# Patient Record
Sex: Male | Born: 1945 | Race: Black or African American | Hispanic: No | Marital: Married | Smoking: Never smoker
Health system: Southern US, Community
[De-identification: ages and names within clinical notes are randomized; demographics above are authoritative.]

## PROBLEM LIST (undated history)

## (undated) DIAGNOSIS — M1990 Unspecified osteoarthritis, unspecified site: Secondary | ICD-10-CM

## (undated) DIAGNOSIS — Z951 Presence of aortocoronary bypass graft: Secondary | ICD-10-CM

## (undated) DIAGNOSIS — I4891 Unspecified atrial fibrillation: Secondary | ICD-10-CM

## (undated) DIAGNOSIS — M329 Systemic lupus erythematosus, unspecified: Secondary | ICD-10-CM

---

## 2014-05-27 ENCOUNTER — Encounter (HOSPITAL_COMMUNITY): Payer: Self-pay | Admitting: *Deleted

## 2014-05-27 ENCOUNTER — Emergency Department (HOSPITAL_COMMUNITY)
Admission: EM | Admit: 2014-05-27 | Discharge: 2014-05-27 | Disposition: A | Discharge status: Home / Self Care | Payer: Medicare Other | Attending: Emergency Medicine | Admitting: Emergency Medicine

## 2014-05-27 ENCOUNTER — Emergency Department (HOSPITAL_COMMUNITY): Payer: Medicare Other

## 2014-05-27 DIAGNOSIS — M1990 Unspecified osteoarthritis, unspecified site: Secondary | ICD-10-CM | POA: Insufficient documentation

## 2014-05-27 DIAGNOSIS — Z79899 Other long term (current) drug therapy: Secondary | ICD-10-CM | POA: Diagnosis not present

## 2014-05-27 DIAGNOSIS — G8929 Other chronic pain: Secondary | ICD-10-CM | POA: Insufficient documentation

## 2014-05-27 DIAGNOSIS — R5383 Other fatigue: Secondary | ICD-10-CM | POA: Diagnosis not present

## 2014-05-27 DIAGNOSIS — Z8679 Personal history of other diseases of the circulatory system: Secondary | ICD-10-CM | POA: Insufficient documentation

## 2014-05-27 DIAGNOSIS — Z7902 Long term (current) use of antithrombotics/antiplatelets: Secondary | ICD-10-CM | POA: Insufficient documentation

## 2014-05-27 DIAGNOSIS — M791 Myalgia: Secondary | ICD-10-CM | POA: Diagnosis present

## 2014-05-27 DIAGNOSIS — R197 Diarrhea, unspecified: Secondary | ICD-10-CM | POA: Diagnosis not present

## 2014-05-27 DIAGNOSIS — Z7952 Long term (current) use of systemic steroids: Secondary | ICD-10-CM | POA: Insufficient documentation

## 2014-05-27 DIAGNOSIS — Z951 Presence of aortocoronary bypass graft: Secondary | ICD-10-CM | POA: Insufficient documentation

## 2014-05-27 HISTORY — DX: Systemic lupus erythematosus, unspecified: M32.9

## 2014-05-27 HISTORY — DX: Unspecified atrial fibrillation: I48.91

## 2014-05-27 HISTORY — DX: Presence of aortocoronary bypass graft: Z95.1

## 2014-05-27 HISTORY — DX: Unspecified osteoarthritis, unspecified site: M19.90

## 2014-05-27 LAB — URINALYSIS, ROUTINE W REFLEX MICROSCOPIC
Bilirubin Urine: NEGATIVE
GLUCOSE, UA: NEGATIVE mg/dL
HGB URINE DIPSTICK: NEGATIVE
Ketones, ur: NEGATIVE mg/dL
LEUKOCYTES UA: NEGATIVE
Nitrite: NEGATIVE
PH: 7.5 (ref 5.0–8.0)
Protein, ur: NEGATIVE mg/dL
SPECIFIC GRAVITY, URINE: 1.017 (ref 1.005–1.030)
UROBILINOGEN UA: 0.2 mg/dL (ref 0.0–1.0)

## 2014-05-27 LAB — CBC WITH DIFFERENTIAL/PLATELET
Basophils Absolute: 0 10*3/uL (ref 0.0–0.1)
Basophils Relative: 1 % (ref 0–1)
EOS PCT: 2 % (ref 0–5)
Eosinophils Absolute: 0.1 10*3/uL (ref 0.0–0.7)
HCT: 35.7 % — ABNORMAL LOW (ref 39.0–52.0)
HEMOGLOBIN: 11.9 g/dL — AB (ref 13.0–17.0)
LYMPHS PCT: 28 % (ref 12–46)
Lymphs Abs: 1.1 10*3/uL (ref 0.7–4.0)
MCH: 29.2 pg (ref 26.0–34.0)
MCHC: 33.3 g/dL (ref 30.0–36.0)
MCV: 87.5 fL (ref 78.0–100.0)
MONO ABS: 0.6 10*3/uL (ref 0.1–1.0)
Monocytes Relative: 15 % — ABNORMAL HIGH (ref 3–12)
Neutro Abs: 2.2 10*3/uL (ref 1.7–7.7)
Neutrophils Relative %: 54 % (ref 43–77)
Platelets: 101 10*3/uL — ABNORMAL LOW (ref 150–400)
RBC: 4.08 MIL/uL — ABNORMAL LOW (ref 4.22–5.81)
RDW: 14.3 % (ref 11.5–15.5)
WBC: 4 10*3/uL (ref 4.0–10.5)

## 2014-05-27 LAB — COMPREHENSIVE METABOLIC PANEL
ALK PHOS: 79 U/L (ref 39–117)
ALT: 18 U/L (ref 0–53)
AST: 22 U/L (ref 0–37)
Albumin: 3.8 g/dL (ref 3.5–5.2)
Anion gap: 3 — ABNORMAL LOW (ref 5–15)
BILIRUBIN TOTAL: 0.7 mg/dL (ref 0.3–1.2)
BUN: 10 mg/dL (ref 6–23)
CO2: 29 mmol/L (ref 19–32)
CREATININE: 1 mg/dL (ref 0.50–1.35)
Calcium: 9.3 mg/dL (ref 8.4–10.5)
Chloride: 107 mEq/L (ref 96–112)
GFR calc Af Amer: 86 mL/min — ABNORMAL LOW (ref >90)
GFR, EST NON AFRICAN AMERICAN: 74 mL/min — AB (ref >90)
Glucose, Bld: 99 mg/dL (ref 70–99)
Potassium: 3.7 mmol/L (ref 3.5–5.1)
Sodium: 139 mmol/L (ref 135–145)
Total Protein: 7.1 g/dL (ref 6.0–8.3)

## 2014-05-27 LAB — TROPONIN I

## 2014-05-27 LAB — CLOSTRIDIUM DIFFICILE BY PCR: Toxigenic C. Difficile by PCR: NEGATIVE

## 2014-05-27 LAB — LACTIC ACID, PLASMA: Lactic Acid, Venous: 0.9 mmol/L (ref 0.5–2.2)

## 2014-05-27 LAB — LIPASE, BLOOD: LIPASE: 27 U/L (ref 11–59)

## 2014-05-27 LAB — POC OCCULT BLOOD, ED: Fecal Occult Bld: NEGATIVE

## 2014-05-27 NOTE — ED Provider Notes (Signed)
CSN: [id number]     Arrival date & time 05/27/14  0055 History   First MD Initiated Contact with Patient 05/27/14 0146     Chief Complaint  Patient presents with  . body aches       HPI Pt was seen at 0155. Per pt and his family, c/o gradual onset and persistence of constant generalized body aches and fatigue that began yesterday morning. Pt states he "didn't sleep so good" last night due to generalized body aches. Pt's family states he has "laid in bed most of the day" because he "felt tired." Pt's family is not sure if pt has been taking his usual chronic pain medication (oxycodone) "on schedule" since last night. Pt's family states pt has also started with "a cough" and "some loose stool" today. Denies CP/palpitations, no SOB, no back pain, no abd pain, no N/V, no black or blood in stools, no focal motor weakness, no tingling/numbness in extremities, no syncope/near syncope, no fevers, no rash.    Past Medical History  Diagnosis Date  . Lupus   . S/P CABG (coronary artery bypass graft)   . Atrial fibrillation   . Arthritis    History reviewed. No pertinent past surgical history.  History  Substance Use Topics  . Smoking status: Never Smoker   . Smokeless tobacco: Not on file  . Alcohol Use: No    Review of Systems ROS: Statement: All systems negative except as marked or noted in the HPI; Constitutional: Negative for fever and chills. +generalized body aches/fatigue.; ; Eyes: Negative for eye pain, redness and discharge. ; ; ENMT: Negative for ear pain, hoarseness, nasal congestion, sinus pressure and sore throat. ; ; Cardiovascular: Negative for chest pain, palpitations, diaphoresis, dyspnea and peripheral edema. ; ; Respiratory: +cough. Negative for wheezing and stridor. ; ; Gastrointestinal: +"loose stool." Negative for nausea, vomiting, abdominal pain, blood in stool, hematemesis, jaundice and rectal bleeding. . ; ; Genitourinary: Negative for dysuria, flank pain and hematuria. ;  ; Musculoskeletal: Negative for back pain and neck pain. Negative for swelling and trauma.; ; Skin: Negative for pruritus, rash, abrasions, blisters, bruising and skin lesion.; ; Neuro: Negative for headache, lightheadedness and neck stiffness. Negative for altered level of consciousness , altered mental status, extremity weakness, paresthesias, involuntary movement, seizure and syncope.     Allergies  Review of patient's allergies indicates no known allergies.  Home Medications   Prior to Admission medications   Medication Sig Start Date End Date Taking? Authorizing Provider  albuterol (PROVENTIL HFA;VENTOLIN HFA) 108 (90 BASE) MCG/ACT inhaler Inhale 2 puffs into the lungs every 4 (four) hours as needed for wheezing or shortness of breath.   Yes Historical Provider, MD  dabigatran (PRADAXA) 150 MG CAPS capsule Take 150 mg by mouth 2 (two) times daily.   Yes Historical Provider, MD  dofetilide (TIKOSYN) 500 MCG capsule Take 500 mcg by mouth 2 (two) times daily.   Yes Historical Provider, MD  esomeprazole (NEXIUM) 40 MG capsule Take 40 mg by mouth daily at 12 noon.   Yes Historical Provider, MD  ferrous sulfate 325 (65 FE) MG tablet Take 325 mg by mouth daily with breakfast.   Yes Historical Provider, MD  folic acid (FOLVITE) 1 MG tablet Take 1 mg by mouth daily.   Yes Historical Provider, MD  gabapentin (NEURONTIN) 400 MG capsule Take 400 mg by mouth 3 (three) times daily.   Yes Historical Provider, MD  ipratropium (ATROVENT HFA) 17 MCG/ACT inhaler Inhale 2 puffs into the  lungs every 6 (six) hours.   Yes Historical Provider, MD  methotrexate (RHEUMATREX) 2.5 MG tablet Take 2.5 mg by mouth once a week. Caution:Chemotherapy. Protect from light.   Yes Historical Provider, MD  nebivolol (BYSTOLIC) 10 MG tablet Take 10 mg by mouth daily.   Yes Historical Provider, MD  oxycodone (ROXICODONE) 30 MG immediate release tablet Take 30 mg by mouth every 6 (six) hours as needed for pain.   Yes Historical  Provider, MD  predniSONE (DELTASONE) 20 MG tablet Take 20 mg by mouth daily with breakfast.   Yes Historical Provider, MD   BP 158/89 mmHg  Pulse 83  Temp(Src) 97.7 F (36.5 C)  Resp 27  Ht 5\' 9"  (1.753 m)  SpO2 94% Physical Exam  0200: Physical examination:  Nursing notes reviewed; Vital signs and O2 SAT reviewed;  Constitutional: Well developed, Well nourished, Well hydrated, In no acute distress; Head:  Normocephalic, atraumatic; Eyes: EOMI, PERRL, No scleral icterus; ENMT: Mouth and pharynx normal, Mucous membranes moist; Neck: Supple, Full range of motion, No lymphadenopathy; Cardiovascular: Regular rate and rhythm, No gallop; Respiratory: Breath sounds clear & equal bilaterally, No wheezes.  Speaking full sentences with ease, Normal respiratory effort/excursion; Chest: Nontender, Movement normal; Abdomen: Soft, Nontender, Nondistended, Normal bowel sounds; Genitourinary: No CVA tenderness; Extremities: Pulses normal, No tenderness, No edema, No calf edema or asymmetry.; Neuro: AA&Ox3, Major CN grossly intact. No facial droop. Speech clear. Grips equal. Strength 5/5 equal bilat UE's and LE's. No gross focal motor or sensory deficits in extremities.; Skin: Color normal, Warm, Dry.   ED Course  Procedures     EKG Interpretation   Date/Time:  Sunday May 27 2014 02:09:29 EST Ventricular Rate:  64 PR Interval:  248 QRS Duration: 82 QT Interval:  488 QTC Calculation: 504 R Axis:   36 Text Interpretation:  Sinus rhythm with 1st degree A-V block Prolonged QT  interval Baseline wander No old tracing to compare Confirmed by [facility]   MD, Nicholos JohnsKATHLEEN [phone]([id number]) on 05/27/2014 5:12:30 AM      MDM  MDM Reviewed: previous chart, nursing note and vitals Reviewed previous: labs and ECG Interpretation: labs, ECG and x-ray      Results for orders placed or performed during the hospital encounter of 05/27/14  Urinalysis, Routine w reflex microscopic  Result Value Ref Range   Color,  Urine YELLOW YELLOW   APPearance CLEAR CLEAR   Specific Gravity, Urine 1.017 1.005 - 1.030   pH 7.5 5.0 - 8.0   Glucose, UA NEGATIVE NEGATIVE mg/dL   Hgb urine dipstick NEGATIVE NEGATIVE   Bilirubin Urine NEGATIVE NEGATIVE   Ketones, ur NEGATIVE NEGATIVE mg/dL   Protein, ur NEGATIVE NEGATIVE mg/dL   Urobilinogen, UA 0.2 0.0 - 1.0 mg/dL   Nitrite NEGATIVE NEGATIVE   Leukocytes, UA NEGATIVE NEGATIVE  CBC with Differential  Result Value Ref Range   WBC 4.0 4.0 - 10.5 K/uL   RBC 4.08 (L) 4.22 - 5.81 MIL/uL   Hemoglobin 11.9 (L) 13.0 - 17.0 g/dL   HCT 40.935.7 (L) 81.139.0 - 91.452.0 %   MCV 87.5 78.0 - 100.0 fL   MCH 29.2 26.0 - 34.0 pg   MCHC 33.3 30.0 - 36.0 g/dL   RDW 78.214.3 95.611.5 - 21.315.5 %   Platelets 101 (L) 150 - 400 K/uL   Neutrophils Relative % 54 43 - 77 %   Neutro Abs 2.2 1.7 - 7.7 K/uL   Lymphocytes Relative 28 12 - 46 %   Lymphs Abs 1.1 0.7 -  4.0 K/uL   Monocytes Relative 15 (H) 3 - 12 %   Monocytes Absolute 0.6 0.1 - 1.0 K/uL   Eosinophils Relative 2 0 - 5 %   Eosinophils Absolute 0.1 0.0 - 0.7 K/uL   Basophils Relative 1 0 - 1 %   Basophils Absolute 0.0 0.0 - 0.1 K/uL  Comprehensive metabolic panel  Result Value Ref Range   Sodium 139 135 - 145 mmol/L   Potassium 3.7 3.5 - 5.1 mmol/L   Chloride 107 96 - 112 mEq/L   CO2 29 19 - 32 mmol/L   Glucose, Bld 99 70 - 99 mg/dL   BUN 10 6 - 23 mg/dL   Creatinine, Ser 1.61 0.50 - 1.35 mg/dL   Calcium 9.3 8.4 - 09.6 mg/dL   Total Protein 7.1 6.0 - 8.3 g/dL   Albumin 3.8 3.5 - 5.2 g/dL   AST 22 0 - 37 U/L   ALT 18 0 - 53 U/L   Alkaline Phosphatase 79 39 - 117 U/L   Total Bilirubin 0.7 0.3 - 1.2 mg/dL   GFR calc non Af Amer 74 (L) >90 mL/min   GFR calc Af Amer 86 (L) >90 mL/min   Anion gap 3 (L) 5 - 15  Lipase, blood  Result Value Ref Range   Lipase 27 11 - 59 U/L  Troponin I  Result Value Ref Range   Troponin I <0.03 <0.031 ng/mL  Lactic acid, plasma  Result Value Ref Range   Lactic Acid, Venous 0.9 0.5 - 2.2 mmol/L  POC  occult blood, ED  Result Value Ref Range   Fecal Occult Bld NEGATIVE NEGATIVE   Dg Abd Acute W/chest 05/27/2014   CLINICAL DATA:  Diarrhea for 1 day. Chest pain for 1 day with shortness of breath and cough.  EXAM: ACUTE ABDOMEN SERIES (ABDOMEN 2 VIEW & CHEST 1 VIEW)  COMPARISON:  None.  FINDINGS: There is mild cardiomegaly. The lungs are hyperinflated with bibasilar atelectasis or scarring. No consolidation to suggest pneumonia. Pulmonary vasculature is normal.  There is no free intra-abdominal air. No dilated bowel loops to suggest obstruction. Small volume of stool throughout the right colon. There are no radiopaque calculi. Degenerative change throughout the spine. No acute osseous abnormalities are seen.  IMPRESSION: 1. Nonobstructive bowel gas pattern.  No free air. 2. Mild pulmonary hyperinflation with minimal bibasilar atelectasis or scarring. Mild cardiomegaly.   Electronically Signed   By: Rubye Oaks M.D.   On: 05/27/2014 03:00    0615:  Pt slept most of his ED visit and is now awake/alert. Pt is not orthostatic on VS. Pt had one stool while in the ED; stool studies pending. Pt ambulated with his cane with steady gait, easy resps, NAD and per his baseline per his family observing. Pt's family would like to take him home now. Pt already has oxycodone for pain.  Dx and testing d/w pt and family.  Questions answered.  Verb understanding, agreeable to d/c home with outpt f/u.   Samuel Jester, DO 05/30/14 1317

## 2014-05-27 NOTE — ED Notes (Signed)
Informed pt that we need a urine sample when he gets back from xray

## 2014-05-27 NOTE — ED Notes (Signed)
Patient ambulated well with cane and stand by assist

## 2014-05-27 NOTE — ED Notes (Signed)
The pt has lupus and has had body aches all day and he has also had diarrhea.  He has been in the bed all day

## 2014-05-27 NOTE — ED Notes (Signed)
Pt to xray at this time.

## 2014-05-27 NOTE — Discharge Instructions (Signed)
°Emergency Department Resource Guide °1) Find a Doctor and Pay Out of Pocket °Although you won't have to find out who is covered by your insurance plan, it is a good idea to ask around and get recommendations. You will then need to call the office and see if the doctor you have chosen will accept you as a new patient and what types of options they offer for patients who are self-pay. Some doctors offer discounts or will set up payment plans for their patients who do not have insurance, but you will need to ask so you aren't surprised when you get to your appointment. ° °2) Contact Your Local Health Department °Not all health departments have doctors that can see patients for sick visits, but many do, so it is worth a call to see if yours does. If you don't know where your local health department is, you can check in your phone book. The CDC also has a tool to help you locate your state's health department, and many state websites also have listings of all of their local health departments. ° °3) Find a Walk-in Clinic °If your illness is not likely to be very severe or complicated, you may want to try a walk in clinic. These are popping up all over the country in pharmacies, drugstores, and shopping centers. They're usually staffed by nurse practitioners or physician assistants that have been trained to treat common illnesses and complaints. They're usually fairly quick and inexpensive. However, if you have serious medical issues or chronic medical problems, these are probably not your best option. ° °No Primary Care Doctor: °- Call [facility] at  832-8000 - they can help you locate a primary care doctor that  accepts your insurance, provides certain services, etc. °- Physician Referral Service- 1-800-533-3463 ° °Chronic Pain Problems: °Organization         Address  Phone   Notes  °[facility]  (336) 297-2271 Patients need to be referred by their primary care doctor.  ° °Medication  Assistance: °Organization         Address  Phone   Notes  °[place] [facility] [address] °[place], [place] [address] (336) 641-8030 --Must be a resident of [place] °-- Must have NO insurance coverage whatsoever (no Medicaid/ Medicare, etc.) °-- The pt. MUST have a primary care doctor that directs their care regularly and follows them in the community °  °[facility]  (866) 331-1348   °[employer]  (888) 892-1162   ° °Agencies that provide inexpensive medical care: °Organization         Address  Phone   Notes  °[facility]  (336) 832-8035   °[facility]    (336) 832-7272   °[facility] [address] °[place], [place] [address] (336) 832-4777   °[facility] [address], °[place] (336) 271-4999   °[facility]    (336) 373-0678   °[facility]    (336) 272-1050   °[facility] ° [address], [place] Phone:  (336) 832-4444, Fax:  (336) 832-4440 Hours of Operation:  9 am - 6 pm, M-F.  Also accepts Medicaid/Medicare and self-pay.  ° [facility] ° [address], [place] Phone: (336) [phone], Fax: (336) [phone]. Hours of Operation:  8:30 am - 5:30 pm, M-F.  Also accepts Medicaid and self-pay.  °[facility] [place] [address], [place] Phone: (336) 878-6027   °[facility] [address], [place], [place] (336)723-1848, Ext. 123 Mondays & Thursdays: 7-9 AM.  First 15 patients are seen on a first come, first serve basis. °  ° °Medicaid-accepting [place] Providers: ° °Organization         Address  Phone   Notes  °[facility] [address], [place] (336) 641-2100 Also accepts self-pay patients.  °[facility] [address], [place] ° (336) 856-9996   °[facility] [address], [place]  (336) 288-8857   °[facility] [address] [place] [address], [place] (336) 299-7000   °Veita Bland [address], [place]  ° (336) 373-1557 Only accepts [place] Access Medicaid patients after they have their name applied to their card.  ° °Self-Pay (no insurance) in [place]: ° °Organization         Address  Phone   Notes  °Sickle Cell Patients, [facility] [address], [place] (336) 832-1970   °[facility] [address], [place] (336) 832-4400   °[facility] ° [address], [place] (336) 992-4800   °[facility]/Dr. Osei-Bonsu ° [address] [place] [address], [place] or [address], [place] (336) [phone] Phone number for both [place] and [place] locations is the same.  °[facility] [address], [place] (336) 299-0000   °[facility] [address] [place] [address], [place] or [address] (336) 852-7530 °(336) 878-2260   °[facility] [address], [place] (336) 350-1642, phone; (336) 294-5005, fax Sees patients 1st and 3rd Saturday of every month.  Must not qualify for public or private insurance (i.e. Medicaid, Medicare, [place] Health Choice, Veterans' Benefits) • Household income should be no more than 200% of the poverty level •The clinic cannot treat you if you are pregnant or think you are pregnant • Sexually transmitted diseases are not treated at the clinic.  ° ° °Dental Care: °Organization         Address  Phone  Notes  °[place] [facility] [address], [place] (336) 641-6152 Accepts children up to age 21 who are enrolled in Medicaid or [place] Health Choice; pregnant women with a Medicaid card; and children who have applied for Medicaid or [place] Health Choice, but were declined, whose parents can pay a reduced fee at time of service.  °[place]  [facility] [place]  [address], [place] (336) 641-7733 Accepts children up to age 21 who are enrolled in Medicaid or [place] Health Choice; pregnant women with a Medicaid card; and children who have applied for Medicaid or [place] Health Choice, but were declined, whose parents can pay a reduced fee at time of service.  °[facility] ° [address],  (336) 641-4533 Patients are seen by appointment only. Walk-ins are not accepted. [facility] will see patients 18 years of age and older. °Monday - Tuesday (8am-5pm) °Most Wednesdays (8:30-5pm) °$30 per visit, cash only  °[facility] ° [address], [place] (336) 641-4533 Patients are seen by appointment only. Walk-ins are not accepted. [facility] will see patients 18 years of age and older. °One   Wednesday Evening (Monthly: Volunteer Based).  $30 per visit, cash only  °[facility]  (919) 537-3737 for adults; Children under age 4, call Graduate Pediatric Dentistry at (919) [phone]. Children aged 4-14, please call (919) [phone] to request a pediatric application. ° Dental services are provided in all areas of dental care including fillings, crowns and bridges, complete and partial dentures, implants, gum treatment, root canals, and extractions. Preventive care is also provided. Treatment is provided to both adults and children. °Patients are selected via a lottery and there is often a waiting list. °  °[facility] [address], °[place] ° (336) 763-8833 [email or web address] °  °[facility] [address], [place], [place] (336)723-1848, Ext. 123 Second and Fourth Thursday of each month, opens at 6:30 AM; Clinic ends at 9 AM.  Patients are seen on a first-come first-served basis, and a limited number are seen during each clinic.  ° °[facility] ° [address], [place], [place] (336) 723-7904    Eligibility Requirements °You must have lived in [place], [place], or [place] counties for at least the last three months. °  You cannot be eligible for state or federal sponsored healthcare insurance, including Veterans Administration, Medicaid, or Medicare. °  You generally cannot be eligible for healthcare insurance through your employer.  °  How to apply: °Eligibility screenings are held every Tuesday and Wednesday afternoon from 1:00 pm until 4:00 pm. You do not need an appointment for the interview!  °[facility] [address], [place], [place] 336-631-2330   °[facility] [facility]  336-342-8273   °[facility]  336-703-3100   °[facility]  336-570-6415   ° °Behavioral Health Resources in the Community: °Intensive Outpatient Programs °Organization         Address  Phone  Notes  °[facility] [address], [place], [place] 336-878-6098   °[facility] [address], [place], [place] 336-832-9800   °[facility] [address], [place], [place] ° 336-882-2125   °[facility] [address],  °[place], [place] 1-800-853-5163 or 336-641-4981   °Substance Abuse Resources °Organization         Address  Phone  Notes  °[facility]  336-882-2125   °[facility]  336-784-9470   °[facility]  336-285-9073   °[facility]  336-845-3988   °Residential & Outpatient Substance Abuse Program  1-800-659-3381   °Psychological Services °Organization         Address  Phone  Notes  °[facility]  336- 832-9600   °[facility]  336- 378-7881   °[facility] [address], [place] 1-800-853-5163 or 336-641-4981   ° °Mobile Crisis Teams °Organization         Address  Phone  Notes  °[facility], Mobile Crisis Care Unit  1-877-626-1772   °Assertive °[facility] ° [address]  [place], [place] 336-834-9664   °Sharon DeEsch [address] °[place] 336-554-5454   ° °Self-Help/Support Groups °Organization         Address  Phone             Notes  °[facility] - variety of support groups  336- 373-1402 Call for more information  °Narcotics Anonymous (NA), [facility] [address], °[place]  2 meetings at this location  ° °  Residential Treatment Programs °Organization         Address  Phone  Notes  °[facility] [address],    °[place]  1-866-801-8205   °[facility] ° [address], [place], [place] 704-293-8524   °[facility] [address], [place] 336-845-3988 Admissions: 8am-3pm M-F  °[facility] [address]    °[place], [place] 336-841-1104   °[facility] [address], [place], [place] 336-379-7146   °[facility] [address]  °[place], [place] 336-285-9073   °[facility] - Intensive Outpatient [address], [place], [place] 336-852-3033   °[facility] [address]  °[place], [place] 1-877-615-2722 or 336-784-9470   °[facility] [address] [place], [place] 336-227-7417 Accepts Medicaid  °[facility] [address]  ° [place] 1-800-659-3381 Substance Abuse/Addiction Treatment  ° °[place] Behavioral Health Resources °Organization         Address  Phone  Notes  °[facility]  (888) 581-9988   °Julie Brannon, PhD [address] [place], [place]   (336) 349-5553 or (336) 951-0000   °[facility]   [address] °[place], [place] (336) 349-4454   °[facility] [address], [place], [place] (336) 342-8316 Insurance/Medicaid/sponsorship through [facility]  °[facility] [address]                                    [place], [place] (336) 342-8316 Therapy/tele-psych/case    °[facility] [address]  ° [place], [place] (336) 349-2233    °Dr. Arfeen  (336) 349-4544   °[facility] [place]  [employer] [place] [facility] 1) [address], [place] °2) [address], [place] °3)  [address], [place] (336) 349-3220 °(336) 342-7768 ° °(336) 342-8140   °[place] Child Abuse [facility] (336) 342-1394 or (336) 342-3537 (After Hours)    ° ° ° °Take your usual prescriptions as previously directed.  Call your regular medical doctor tomorrow to schedule a follow up appointment within the next 2 days. Return to the Emergency Department immediately sooner if worsening.  ° °

## 2014-05-28 LAB — URINE CULTURE: Colony Count: 30000

## 2014-05-29 ENCOUNTER — Telehealth (HOSPITAL_BASED_OUTPATIENT_CLINIC_OR_DEPARTMENT_OTHER): Payer: Self-pay | Admitting: Emergency Medicine

## 2014-05-29 LAB — GI PATHOGEN PANEL BY PCR, STOOL
C difficile toxin A/B: NOT DETECTED
CRYPTOSPORIDIUM BY PCR: NOT DETECTED
Campylobacter by PCR: NOT DETECTED
E COLI (ETEC) LT/ST: NOT DETECTED
E COLI 0157 BY PCR: NOT DETECTED
E coli (STEC): NOT DETECTED
G lamblia by PCR: NOT DETECTED
NOROVIRUS G1/G2: NOT DETECTED
Rotavirus A by PCR: NOT DETECTED
Salmonella by PCR: NOT DETECTED
Shigella by PCR: NOT DETECTED

## 2014-05-29 NOTE — Telephone Encounter (Signed)
Post ED Visit - Positive Culture Follow-up  Culture report reviewed by antimicrobial stewardship pharmacist: []  Wes Dulaney, Pharm.D., BCPS [x]  Celedonio MiyamotoJeremy Frens, [address] BCPS []  Georgina PillionElizabeth Martin, Pharm.D., BCPS []  MeridenMinh Pham, [address] BCPS, AAHIVP []  Estella HuskMichelle Turner, Pharm.D., BCPS, AAHIVP []  Elder CyphersLorie Poole, [address] BCPS   Positive urine culture Group B strep Treated with none, symptoms resolved and no further patient follow-up is required at this time.  Berle MullMiller, Sharian Delia 05/29/2014, 10:17 AM

## 2016-05-11 DEATH — deceased

## 2016-05-19 IMAGING — DX DG ABDOMEN ACUTE W/ 1V CHEST
3 series · 3 of 3 positions shown · non-contrast
Comparison: None.

CLINICAL DATA: Diarrhea for 1 day. Chest pain for 1 day with
shortness of breath and cough.

EXAM:
ACUTE ABDOMEN SERIES (ABDOMEN 2 VIEW & CHEST 1 VIEW)

[chest pa]
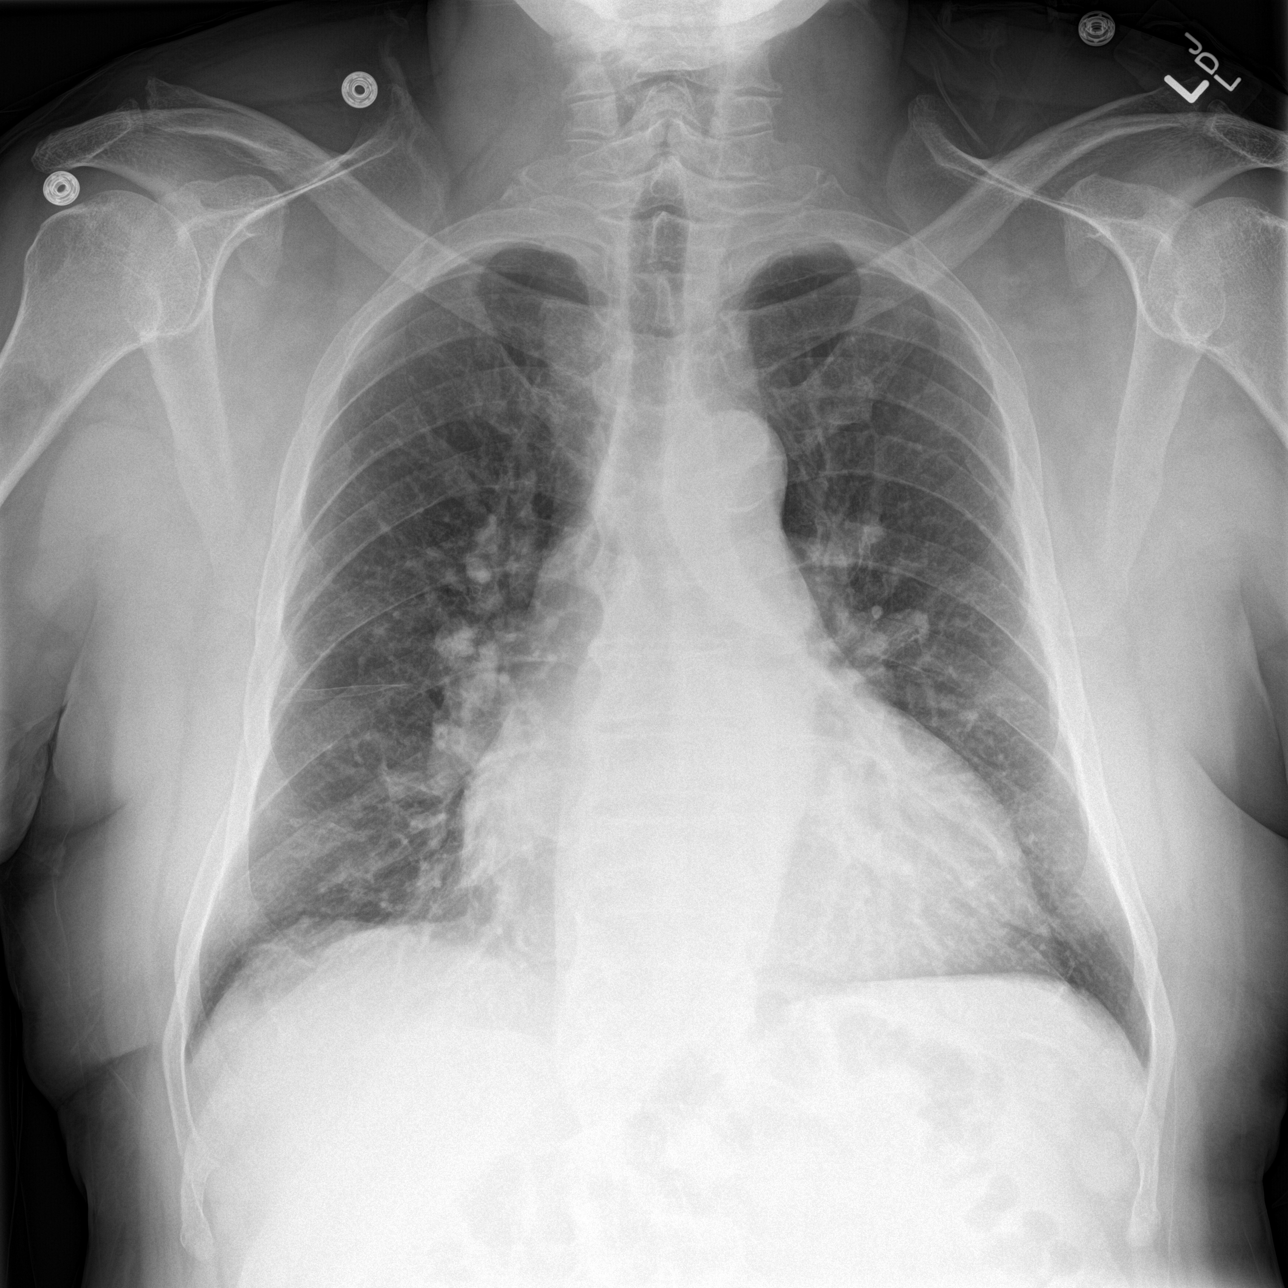

[abdomen erect]
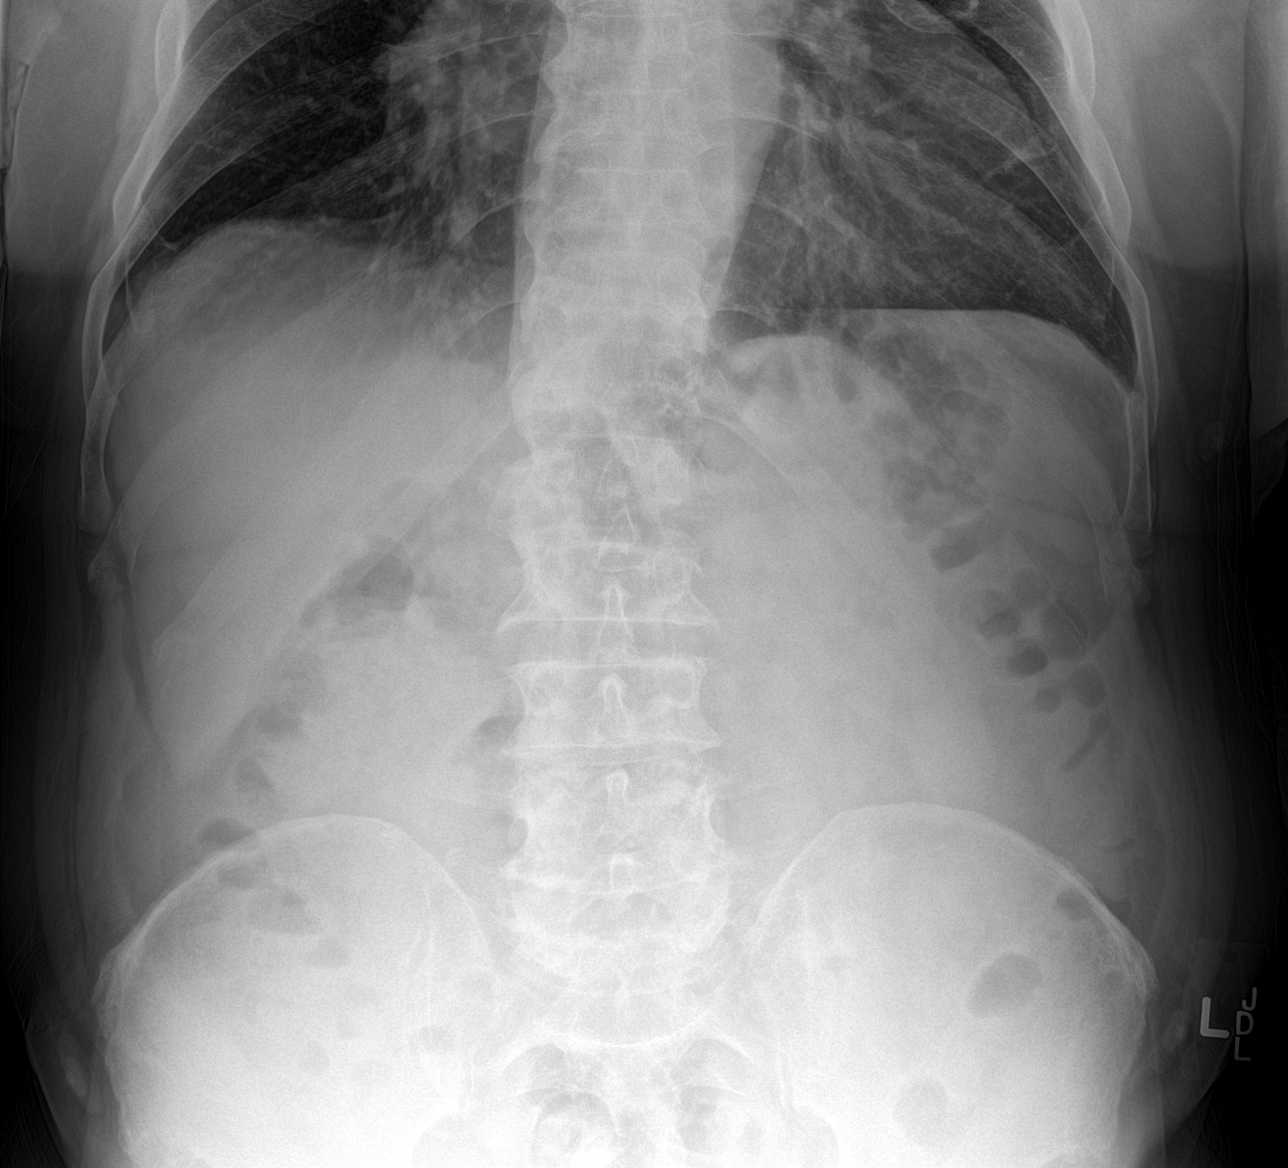

[abdomen supine]
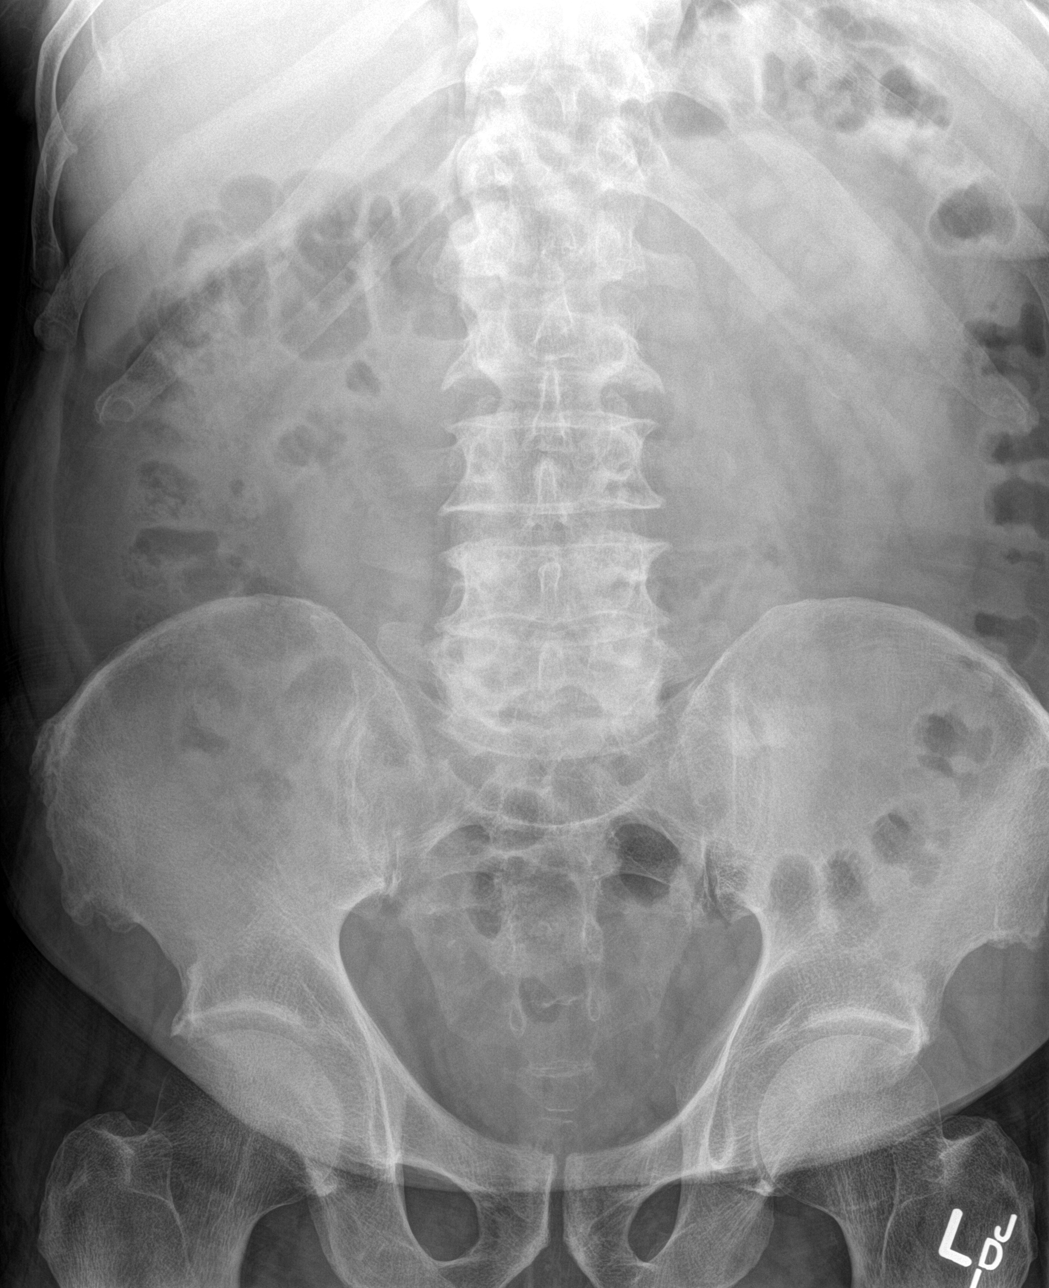

[3 of 3 positions shown; findings below may reference images not displayed]

FINDINGS: There is mild cardiomegaly. The lungs are hyperinflated with
bibasilar atelectasis or scarring. No consolidation to suggest
pneumonia. Pulmonary vasculature is normal.

There is no free intra-abdominal air. No dilated bowel loops to
suggest obstruction. Small volume of stool throughout the right
colon. There are no radiopaque calculi. Degenerative change
throughout the spine. No acute osseous abnormalities are seen.
IMPRESSION: 1. Nonobstructive bowel gas pattern.  No free air.
2. Mild pulmonary hyperinflation with minimal bibasilar atelectasis
or scarring. Mild cardiomegaly.
